# Patient Record
Sex: Male | Born: 2000 | Race: Black or African American | Hispanic: No | Marital: Single | Smoking: Never smoker
Health system: Southern US, Community
[De-identification: ages and names within clinical notes are randomized; demographics above are authoritative.]

---
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
## 2019-04-10 ENCOUNTER — Telehealth (HOSPITAL_COMMUNITY): Payer: Self-pay

## 2019-04-10 ENCOUNTER — Other Ambulatory Visit: Payer: Self-pay

## 2019-04-10 ENCOUNTER — Encounter (HOSPITAL_COMMUNITY): Payer: Self-pay | Admitting: Emergency Medicine

## 2019-04-10 ENCOUNTER — Emergency Department (HOSPITAL_COMMUNITY)
Admission: EM | Admit: 2019-04-10 | Discharge: 2019-04-10 | Disposition: A | Discharge status: Home / Self Care | Payer: 59 | Attending: Emergency Medicine | Admitting: Emergency Medicine

## 2019-04-10 DIAGNOSIS — R0981 Nasal congestion: Secondary | ICD-10-CM | POA: Insufficient documentation

## 2019-04-10 DIAGNOSIS — R05 Cough: Secondary | ICD-10-CM | POA: Insufficient documentation

## 2019-04-10 DIAGNOSIS — R0989 Other specified symptoms and signs involving the circulatory and respiratory systems: Secondary | ICD-10-CM

## 2019-04-10 DIAGNOSIS — R509 Fever, unspecified: Secondary | ICD-10-CM | POA: Diagnosis present

## 2019-04-10 DIAGNOSIS — Z20822 Contact with and (suspected) exposure to covid-19: Secondary | ICD-10-CM

## 2019-04-10 DIAGNOSIS — U071 COVID-19: Secondary | ICD-10-CM | POA: Insufficient documentation

## 2019-04-10 LAB — SARS CORONAVIRUS 2 (TAT 6-24 HRS): SARS Coronavirus 2: POSITIVE — AB

## 2019-04-10 MED ORDER — FLUTICASONE PROPIONATE 50 MCG/ACT NA SUSP: 1.0000 | Freq: Every day | NASAL | 0 refills | Status: AC

## 2019-04-10 MED ORDER — ACETAMINOPHEN 500 MG PO TABS
1000.0000 mg | ORAL_TABLET | Freq: Once | ORAL | Status: AC
  Administered 2019-04-10: 01:00:00 1000 mg via ORAL
  Filled 2019-04-10: qty 2

## 2019-04-10 MED ORDER — BENZONATATE 100 MG PO CAPS: 200.0000 mg | ORAL_CAPSULE | Freq: Two times a day (BID) | ORAL | 0 refills | Status: AC | PRN

## 2019-04-10 NOTE — ED Provider Notes (Signed)
[name] EMERGENCY DEPARTMENT Provider Note   CSN: [id number] Arrival date & time: 04/10/19  0018     History Chief Complaint  Patient presents with  . Nasal Congestion    Chad Bradley is a 19 y.o. male.  Patient presents to the emergency department with chief complaint of nasal congestion and chest congestion.  He denies any known fever, but is noted to be febrile to 100.8 in triage.  He denies any known sick contacts.  He states that he has not been around anyone with Covid-19.  He reports that he got the first round of his vaccine last week.  He denies any treatments prior to arrival.  Denies any other associated symptoms.  The history is provided by the patient. No language interpreter was used.       History reviewed. No pertinent past medical history.  There are no problems to display for this patient.   History reviewed. No pertinent surgical history.     No family history on file.  Social History   Tobacco Use  . Smoking status: Not on file  Substance Use Topics  . Alcohol use: Not on file  . Drug use: Not on file    Home Medications Prior to Admission medications   Medication Sig Start Date End Date Taking? Authorizing Provider  benzonatate (TESSALON) 100 MG capsule Take 2 capsules (200 mg total) by mouth 2 (two) times daily as needed for cough. 04/10/19   Roxy Horseman, PA-C  fluticasone (FLONASE) 50 MCG/ACT nasal spray Place 1 spray into both nostrils daily. 04/10/19   Roxy Horseman, PA-C    Allergies    Patient has no known allergies.  Review of Systems   Review of Systems  All other systems reviewed and are negative.   Physical Exam Updated Vital Signs BP (!) 141/81 (BP Location: Right Arm)   Pulse 93   Temp (!) 100.7 F (38.2 C)   Resp 16   SpO2 98%   Physical Exam Physical Exam  Constitutional: Pt  is oriented to person, place, and time. Appears well-developed and well-nourished. No distress.  HENT:  Head:  Normocephalic and atraumatic.  Right Ear: Tympanic membrane, external ear and ear canal normal.  Left Ear: Tympanic membrane, external ear and ear canal normal.  Nose: Mucosal edema and no rhinorrhea present. No epistaxis. Right sinus exhibits no maxillary sinus tenderness and no frontal sinus tenderness. Left sinus exhibits no maxillary sinus tenderness and no frontal sinus tenderness.  Mouth/Throat: Uvula is midline and mucous membranes are normal. Mucous membranes are not pale and not cyanotic. No oropharyngeal exudate, posterior oropharyngeal edema, posterior oropharyngeal erythema or tonsillar abscesses.  Eyes: Conjunctivae are normal. Pupils are equal, round, and reactive to light.  Neck: Normal range of motion and full passive range of motion without pain.  Cardiovascular: Normal rate and intact distal pulses.   Pulmonary/Chest: Effort normal and breath sounds normal. No stridor.  Clear and equal breath sounds without focal wheezes, rhonchi, rales  Abdominal: Soft. Bowel sounds are normal. There is no tenderness.  Musculoskeletal: Normal range of motion.  Lymphadenopathy:    Pthas no cervical adenopathy.  Neurological: Pt is alert and oriented to person, place, and time.  Skin: Skin is warm and dry. No rash noted. Pt is not diaphoretic.  Psychiatric: Normal mood and affect.  Nursing note and vitals reviewed.   ED Results / Procedures / Treatments   Labs (all labs ordered are listed, but only abnormal results are displayed) Labs  Reviewed  SARS CORONAVIRUS 2 (TAT 6-24 HRS)    EKG None  Radiology No results found.  Procedures Procedures (including critical care time)  Medications Ordered in ED Medications  acetaminophen (TYLENOL) tablet 1,000 mg (has no administration in time range)    ED Course  I have reviewed the triage vital signs and the nursing notes.  Pertinent labs & imaging results that were available during my care of the patient were reviewed by me and  considered in my medical decision making (see chart for details).    MDM Rules/Calculators/A&P                      Patient with low-grade fever of 100.8 degrees in triage.  He does have cough and sinus congestion.  Oropharynx is clear.  Lung sounds are clear.  Will prescribe Tessalon and Flonase.  Will check Covid.  Patient given instructions regarding return to work.  He is overall well-appearing, and in no acute distress.  I find him stable for discharged home with continued monitoring for symptoms and return for new or worsening symptoms.   Final Clinical Impression(s) / ED Diagnoses Final diagnoses:  Person under investigation for COVID-19  Sinus congestion  Chest congestion    Rx / DC Orders ED Discharge Orders         Ordered    fluticasone (FLONASE) 50 MCG/ACT nasal spray  Daily     04/10/19 0041    benzonatate (TESSALON) 100 MG capsule  2 times daily PRN     04/10/19 0041           Montine Circle, PA-C 04/10/19 0045    Mesner, Corene Cornea, MD 04/10/19 [phone]

## 2019-04-10 NOTE — ED Triage Notes (Signed)
Pt c/o nasal congestion and chest congestion that started last night.

## 2019-04-22 ENCOUNTER — Other Ambulatory Visit: Payer: Self-pay

## 2019-04-22 ENCOUNTER — Emergency Department (HOSPITAL_COMMUNITY)
Admission: EM | Admit: 2019-04-22 | Discharge: 2019-04-22 | Disposition: A | Discharge status: Home / Self Care | Payer: 59 | Attending: Emergency Medicine | Admitting: Emergency Medicine

## 2019-04-22 DIAGNOSIS — T162XXA Foreign body in left ear, initial encounter: Secondary | ICD-10-CM | POA: Diagnosis present

## 2019-04-22 DIAGNOSIS — Y929 Unspecified place or not applicable: Secondary | ICD-10-CM | POA: Diagnosis not present

## 2019-04-22 DIAGNOSIS — Y93E8 Activity, other personal hygiene: Secondary | ICD-10-CM | POA: Diagnosis not present

## 2019-04-22 DIAGNOSIS — X58XXXA Exposure to other specified factors, initial encounter: Secondary | ICD-10-CM | POA: Insufficient documentation

## 2019-04-22 DIAGNOSIS — Y999 Unspecified external cause status: Secondary | ICD-10-CM | POA: Insufficient documentation

## 2019-04-22 NOTE — Discharge Instructions (Addendum)
Return here as needed. Follow up with a primary doctor. °

## 2019-04-22 NOTE — ED Triage Notes (Signed)
Q-tip stuck in left ear since last night. Denies pain, dizziness or decrease in hearing. Also denies drainage.

## 2019-04-22 NOTE — ED Provider Notes (Signed)
[facility] Provider Note   CSN: [id number] Arrival date & time: 04/22/19  1131     History Chief Complaint  Patient presents with  . Foreign Body in Chad Bradley is a 19 y.o. male.  HPI Patient presents to the emergency department with the end of a Q-tip stuck in his left ear canal.  The patient states he was cleaning his ears last night when the and came off.  The patient states that he did not do anything to try to remove the Q-tip.  Patient states he is having no pain or other issues.  He states he is not having any hearing difficulty.    No past medical history on file.  There are no problems to display for this patient.   No past surgical history on file.     No family history on file.  Social History   Tobacco Use  . Smoking status: Not on file  Substance Use Topics  . Alcohol use: Not on file  . Drug use: Not on file    Home Medications Prior to Admission medications   Medication Sig Start Date End Date Taking? Authorizing Provider  benzonatate (TESSALON) 100 MG capsule Take 2 capsules (200 mg total) by mouth 2 (two) times daily as needed for cough. 04/10/19   Montine Circle, PA-C  fluticasone (FLONASE) 50 MCG/ACT nasal spray Place 1 spray into both nostrils daily. 04/10/19   Montine Circle, PA-C    Allergies    Patient has no known allergies.  Review of Systems   Review of Systems   All other systems negative except as documented in the HPI. All pertinent positives and negatives as reviewed in the HPI.  Marland Kitchen Physical Exam Updated Vital Signs BP 115/86   Pulse 65   Temp 99 F (37.2 C) (Oral)   Resp 15   SpO2 100%   Physical Exam HENT:     Head: Normocephalic and atraumatic.     Right Ear: Tympanic membrane normal.     Left Ear: Tympanic membrane normal. A foreign body is present.     Ears:     Comments: Cotton tip from a cotton tip swab.     Nose: Nose normal.     Mouth/Throat:     Mouth: Mucous  membranes are moist.     Pharynx: Oropharynx is clear.  Eyes:     Pupils: Pupils are equal, round, and reactive to light.  Neurological:     Mental Status: He is alert.     ED Results / Procedures / Treatments   Labs (all labs ordered are listed, but only abnormal results are displayed) Labs Reviewed - No data to display  EKG None  Radiology No results found.  Procedures .Foreign Body Removal  Date/Time: 04/22/2019 12:33 PM Performed by: Dalia Heading, PA-C Authorized by: Dalia Heading, PA-C  Consent: Verbal consent obtained. Written consent not obtained. Risks and benefits: risks, benefits and alternatives were discussed Consent given by: patient Patient identity confirmed: verbally with patient Time out: Immediately prior to procedure a "time out" was called to verify the correct patient, procedure, equipment, support staff and site/side marked as required. Body area: ear Location details: left ear  Sedation: Patient sedated: no  Patient restrained: no Localization method: magnification and visualized Removal mechanism: alligator forceps Complexity: simple 1 objects recovered. Objects recovered: end of cotton tip swab Post-procedure assessment: foreign body removed Patient tolerance: patient tolerated the procedure well with no immediate complications   (  including critical care time)  Medications Ordered in ED Medications - No data to display  ED Course  I have reviewed the triage vital signs and the nursing notes.  Pertinent labs & imaging results that were available during my care of the patient were reviewed by me and considered in my medical decision making (see chart for details).    MDM Rules/Calculators/A&P                     Patient has no TM issue.  The patient have a foreign body successfully removed with alligator forceps.  Final Clinical Impression(s) / ED Diagnoses Final diagnoses:  None    Rx / DC Orders ED Discharge  Orders    None       Charlestine Night, PA-C 04/22/19 1234    Derwood Kaplan, MD 04/24/19 [phone]

## 2022-10-10 ENCOUNTER — Other Ambulatory Visit: Payer: Self-pay

## 2022-10-10 ENCOUNTER — Ambulatory Visit
Admission: EM | Admit: 2022-10-10 | Discharge: 2022-10-10 | Disposition: A | Discharge status: Home / Self Care | Payer: PRIVATE HEALTH INSURANCE | Attending: Internal Medicine | Admitting: Internal Medicine

## 2022-10-10 DIAGNOSIS — Z113 Encounter for screening for infections with a predominantly sexual mode of transmission: Secondary | ICD-10-CM

## 2022-10-10 NOTE — ED Provider Notes (Signed)
[facility]    CSN: [id number] Arrival date & time: 10/10/22  1346      History   Chief Complaint Chief Complaint  Patient presents with   std testing    HPI Chad Bradley is a 22 y.o. male.   Patient presents today for routine STD testing.  He denies any associated symptoms or any exposure to STD.     History reviewed. No pertinent past medical history.  There are no problems to display for this patient.   History reviewed. No pertinent surgical history.     Home Medications    Prior to Admission medications   Medication Sig Start Date End Date Taking? Authorizing Provider  benzonatate (TESSALON) 100 MG capsule Take 2 capsules (200 mg total) by mouth 2 (two) times daily as needed for cough. 04/10/19   Roxy Horseman, PA-C  fluticasone (FLONASE) 50 MCG/ACT nasal spray Place 1 spray into both nostrils daily. 04/10/19   Roxy Horseman, PA-C    Family History History reviewed. No pertinent family history.  Social History Social History   Tobacco Use   Smoking status: Never   Smokeless tobacco: Never  Vaping Use   Vaping status: Some Days  Substance Use Topics   Alcohol use: Yes   Drug use: Yes    Types: Marijuana     Allergies   Patient has no known allergies.   Review of Systems Review of Systems Per HPI  Physical Exam Triage Vital Signs ED Triage Vitals  Encounter Vitals Group     BP 10/10/22 1409 135/65     Systolic BP Percentile --      Diastolic BP Percentile --      Pulse Rate 10/10/22 1409 62     Resp 10/10/22 1409 19     Temp 10/10/22 1409 98.5 F (36.9 C)     Temp Source 10/10/22 1409 Oral     SpO2 10/10/22 1409 98 %     Weight 10/10/22 1408 195 lb (88.5 kg)     Height 10/10/22 1408 6\' 3"  (1.905 m)     Head Circumference --      Peak Flow --      Pain Score 10/10/22 1408 0     Pain Loc --      Pain Education --      Exclude from Growth Chart --    No data found.  Updated Vital Signs BP 135/65   Pulse 62    Temp 98.5 F (36.9 C) (Oral)   Resp 19   Ht 6\' 3"  (1.905 m)   Wt 195 lb (88.5 kg)   SpO2 98%   BMI 24.37 kg/m   Visual Acuity Right Eye Distance:   Left Eye Distance:   Bilateral Distance:    Right Eye Near:   Left Eye Near:    Bilateral Near:     Physical Exam Constitutional:      General: He is not in acute distress.    Appearance: Normal appearance. He is not toxic-appearing or diaphoretic.  HENT:     Head: Normocephalic and atraumatic.  Eyes:     Extraocular Movements: Extraocular movements intact.     Conjunctiva/sclera: Conjunctivae normal.  Pulmonary:     Effort: Pulmonary effort is normal.  Genitourinary:    Comments: Deferred with shared decision making. Self swab performed.  Neurological:     General: No focal deficit present.     Mental Status: He is alert and oriented to person, place, and time. Mental  status is at baseline.  Psychiatric:        Mood and Affect: Mood normal.        Behavior: Behavior normal.        Thought Content: Thought content normal.        Judgment: Judgment normal.      UC Treatments / Results  Labs (all labs ordered are listed, but only abnormal results are displayed) Labs Reviewed  HIV ANTIBODY (ROUTINE TESTING W REFLEX)  RPR  CYTOLOGY, (ORAL, ANAL, URETHRAL) ANCILLARY ONLY    EKG   Radiology No results found.  Procedures Procedures (including critical care time)  Medications Ordered in UC Medications - No data to display  Initial Impression / Assessment and Plan / UC Course  I have reviewed the triage vital signs and the nursing notes.  Pertinent labs & imaging results that were available during my care of the patient were reviewed by me and considered in my medical decision making (see chart for details).     Patient presenting for routine STD testing without symptoms or exposure.  Cytology swab, HIV, RPR test pending.  Awaiting results.  Advised strict follow-up precautions.  Patient verbalized  understanding and was agreeable with plan. Final Clinical Impressions(s) / UC Diagnoses   Final diagnoses:  Screening examination for venereal disease     Discharge Instructions      STD testing is pending. Will call if anything is positive and send any treatment.     ED Prescriptions   None    PDMP not reviewed this encounter.   Gustavus Bryant, [place] 10/10/22 1451

## 2022-10-10 NOTE — ED Triage Notes (Signed)
Pt states that he is here for std testing. Pt denies having any symptoms.

## 2022-10-10 NOTE — Discharge Instructions (Signed)
STD testing is pending. Will call if anything is positive and send any treatment.

## 2022-10-11 LAB — CYTOLOGY, (ORAL, ANAL, URETHRAL) ANCILLARY ONLY
Chlamydia: POSITIVE — AB
Comment: NEGATIVE
Comment: NEGATIVE
Comment: NORMAL
Neisseria Gonorrhea: NEGATIVE
Trichomonas: NEGATIVE

## 2022-10-11 LAB — RPR: RPR Ser Ql: NONREACTIVE

## 2022-10-11 LAB — HIV ANTIBODY (ROUTINE TESTING W REFLEX): HIV Screen 4th Generation wRfx: NONREACTIVE

## 2022-10-12 ENCOUNTER — Telehealth: Payer: Self-pay

## 2022-10-12 MED ORDER — DOXYCYCLINE HYCLATE 100 MG PO CAPS: 100.0000 mg | ORAL_CAPSULE | Freq: Two times a day (BID) | ORAL | 0 refills | Status: AC

## 2022-10-12 NOTE — Telephone Encounter (Signed)
Contacted patient by phone.  Verified identity using two identifiers.  Provided positive result.  Reviewed safe sex practices, notifying partners, and refraining from sexual activities for 7 days from time of treatment.  Patient verified understanding, all questions answered.   Pt requires tx with Doxycycline. Reviewed with patient, verified pharmacy, prescription sent.

## 2023-03-07 ENCOUNTER — Other Ambulatory Visit: Payer: Self-pay

## 2023-03-07 ENCOUNTER — Ambulatory Visit
Admission: RE | Admit: 2023-03-07 | Discharge: 2023-03-07 | Disposition: A | Discharge status: Home / Self Care | Payer: Managed Care, Other (non HMO) | Source: Ambulatory Visit | Attending: Family Medicine | Admitting: Family Medicine

## 2023-03-07 VITALS — BP 121/76 | HR 62 | Temp 98.7°F | Resp 16

## 2023-03-07 DIAGNOSIS — Z711 Person with feared health complaint in whom no diagnosis is made: Secondary | ICD-10-CM | POA: Diagnosis present

## 2023-03-07 NOTE — Discharge Instructions (Addendum)
 Your lab results will be available within 24 to 48 hours.  If any treatment is warranted after review test results our staff will reach out to you via phone to advise of any necessary treatment.

## 2023-03-07 NOTE — ED Triage Notes (Signed)
 Requesting testing for STI- denies Sx, denies exposure

## 2023-03-07 NOTE — ED Provider Notes (Signed)
 [facility]    CSN: [id number] Arrival date & time: 03/07/23  1913      History   Chief Complaint Chief Complaint  Patient presents with   Labs Only    HPI Chad Bradley is a 23 y.o. male.   HPI Patient here today for routine STD screening.  Currently asymptomatic.  Would like HIV and RPR testing.  Denies any concerns of pregnancy. Patient is currently sexually active and does not routinely use barrier protection.   History reviewed. No pertinent past medical history.  There are no active problems to display for this patient.   History reviewed. No pertinent surgical history.     Home Medications    Prior to Admission medications   Medication Sig Start Date End Date Taking? Authorizing Provider  benzonatate (TESSALON) 100 MG capsule Take 2 capsules (200 mg total) by mouth 2 (two) times daily as needed for cough. Patient not taking: Reported on 03/07/2023 04/10/19   Roxy Horseman, PA-C  doxycycline (VIBRAMYCIN) 100 MG capsule Take 1 capsule (100 mg total) by mouth 2 (two) times daily. Patient not taking: Reported on 03/07/2023 10/12/22   Merrilee Jansky, MD  fluticasone [facility]) 50 MCG/ACT nasal spray Place 1 spray into both nostrils daily. Patient not taking: Reported on 03/07/2023 04/10/19   Roxy Horseman, PA-C    Family History History reviewed. No pertinent family history.  Social History Social History   Tobacco Use   Smoking status: Never   Smokeless tobacco: Never  Vaping Use   Vaping status: Some Days  Substance Use Topics   Alcohol use: Yes   Drug use: Yes    Types: Marijuana     Allergies   Patient has no known allergies.   Review of Systems Review of Systems Pertinent negatives listed in HPI   Physical Exam Triage Vital Signs ED Triage Vitals  Encounter Vitals Group     BP      Systolic BP Percentile      Diastolic BP Percentile      Pulse      Resp      Temp      Temp src      SpO2      Weight      Height       Head Circumference      Peak Flow      Pain Score      Pain Loc      Pain Education      Exclude from Growth Chart    No data found.  Updated Vital Signs BP 121/76 (BP Location: Left Arm)   Pulse 62   Temp 98.7 F (37.1 C) (Oral)   Resp 16   SpO2 98%   Visual Acuity Right Eye Distance:   Left Eye Distance:   Bilateral Distance:    Right Eye Near:   Left Eye Near:    Bilateral Near:     Physical Exam Vitals reviewed.  Constitutional:      Appearance: Normal appearance.  HENT:     Head: Normocephalic.  Cardiovascular:     Rate and Rhythm: Normal rate and regular rhythm.  Pulmonary:     Effort: Pulmonary effort is normal.     Breath sounds: Normal breath sounds.  Musculoskeletal:        General: Normal range of motion.  Neurological:     General: No focal deficit present.     Mental Status: He is alert.  UC Treatments / Results  Labs (all labs ordered are listed, but only abnormal results are displayed) Labs Reviewed  HIV ANTIBODY (ROUTINE TESTING W REFLEX)  RPR  CYTOLOGY, (ORAL, ANAL, URETHRAL) ANCILLARY ONLY    EKG   Radiology No results found.  Procedures Procedures (including critical care time)  Medications Ordered in UC Medications - No data to display  Initial Impression / Assessment and Plan / UC Course  I have reviewed the triage vital signs and the nursing notes.  Pertinent labs & imaging results that were available during my care of the patient were reviewed by me and considered in my medical decision making (see chart for details).    Routine STD screening, HIV/RPR, gonorrhea/chlamydia, trichomonas.  Patient had no known exposure.  No treatment provided given no exposure.  ACD prevention education provided. Final Clinical Impressions(s) / UC Diagnoses   Final diagnoses:  Concern about STD in male without diagnosis     Discharge Instructions      Your lab results will be available within 24 to 48 hours.  If any treatment  is warranted after review test results our staff will reach out to you via phone to advise of any necessary treatment.     ED Prescriptions   None    PDMP not reviewed this encounter.   Bing Neighbors, NP 03/07/23 2036

## 2023-03-08 LAB — CYTOLOGY, (ORAL, ANAL, URETHRAL) ANCILLARY ONLY
Chlamydia: NEGATIVE
Comment: NEGATIVE
Comment: NEGATIVE
Comment: NORMAL
Neisseria Gonorrhea: NEGATIVE
Trichomonas: NEGATIVE

## 2023-03-10 LAB — RPR: RPR Ser Ql: NONREACTIVE

## 2023-03-10 LAB — HIV ANTIBODY (ROUTINE TESTING W REFLEX): HIV Screen 4th Generation wRfx: NONREACTIVE

## 2023-03-12 ENCOUNTER — Telehealth: Payer: Self-pay | Admitting: *Deleted

## 2023-03-12 ENCOUNTER — Ambulatory Visit

## 2023-03-12 NOTE — Telephone Encounter (Signed)
 Pt scheduled appt for today at [facility] for TB skin test. Notified pt that those aren't done at this location. Gave contact info for [employer] [facility].

## 2023-03-16 ENCOUNTER — Ambulatory Visit (INDEPENDENT_AMBULATORY_CARE_PROVIDER_SITE_OTHER): Admitting: Family Medicine

## 2023-03-16 ENCOUNTER — Encounter: Payer: Self-pay | Admitting: Family Medicine

## 2023-03-16 VITALS — BP 123/77 | HR 69 | Temp 98.4°F | Resp 16 | Ht 75.0 in | Wt 200.4 lb

## 2023-03-16 DIAGNOSIS — Z0000 Encounter for general adult medical examination without abnormal findings: Secondary | ICD-10-CM | POA: Diagnosis not present

## 2023-03-16 DIAGNOSIS — Z111 Encounter for screening for respiratory tuberculosis: Secondary | ICD-10-CM

## 2023-03-16 DIAGNOSIS — Z130 Encounter for screening for diseases of the blood and blood-forming organs and certain disorders involving the immune mechanism: Secondary | ICD-10-CM

## 2023-03-16 DIAGNOSIS — Z7689 Persons encountering health services in other specified circumstances: Secondary | ICD-10-CM

## 2023-03-16 DIAGNOSIS — Z13220 Encounter for screening for lipoid disorders: Secondary | ICD-10-CM

## 2023-03-19 ENCOUNTER — Encounter: Payer: Self-pay | Admitting: Family Medicine

## 2023-03-19 NOTE — Progress Notes (Signed)
 New Patient Office Visit  Subjective    Patient ID: Chad Bradley, male    DOB: [birth year]  Age: 23 y.o. MRN: [id number]  CC:  Chief Complaint  Patient presents with   Establish Care   Annual Exam    Tb test    HPI Chad Bradley presents to establish care and for routine annual exam. Patient denies known chronic med issues or regular meds. Patient denies acute complaints.    Outpatient Encounter Medications as of 03/16/2023  Medication Sig   benzonatate (TESSALON) 100 MG capsule Take 2 capsules (200 mg total) by mouth 2 (two) times daily as needed for cough. (Patient not taking: Reported on 03/07/2023)   doxycycline (VIBRAMYCIN) 100 MG capsule Take 1 capsule (100 mg total) by mouth 2 (two) times daily. (Patient not taking: Reported on 03/07/2023)   fluticasone (FLONASE) 50 MCG/ACT nasal spray Place 1 spray into both nostrils daily. (Patient not taking: Reported on 03/07/2023)   No facility-administered encounter medications on file as of 03/16/2023.    History reviewed. No pertinent past medical history.  History reviewed. No pertinent surgical history.  History reviewed. No pertinent family history.  Social History   Socioeconomic History   Marital status: Single    Spouse name: Not on file   Number of children: Not on file   Years of education: Not on file   Highest education level: Bachelor's degree (e.g., BA, AB, BS)  Occupational History   Not on file  Tobacco Use   Smoking status: Never   Smokeless tobacco: Never  Vaping Use   Vaping status: Some Days  Substance and Sexual Activity   Alcohol use: Yes   Drug use: Yes    Types: Marijuana   Sexual activity: Yes    Birth control/protection: Condom  Other Topics Concern   Not on file  Social History Narrative   Not on file   Social Drivers of Health   Financial Resource Strain: Low Risk  (03/16/2023)   Overall Financial Resource Strain (CARDIA)    Difficulty of Paying Living Expenses: Not hard at all  Food  Insecurity: No Food Insecurity (03/16/2023)   Hunger Vital Sign    Worried About Running Out of Food in the Last Year: Never true    Ran Out of Food in the Last Year: Never true  Transportation Needs: No Transportation Needs (03/16/2023)   PRAPARE - Administrator, Civil Service (Medical): No    Lack of Transportation (Non-Medical): No  Physical Activity: Insufficiently Active (03/16/2023)   Exercise Vital Sign    Days of Exercise per Week: 3 days    Minutes of Exercise per Session: 30 min  Stress: No Stress Concern Present (03/16/2023)   [employer] of Occupational Health - Occupational Stress Questionnaire    Feeling of Stress : Not at all  Social Connections: Moderately Isolated (03/16/2023)   Social Connection and Isolation Panel [NHANES]    Frequency of Communication with Friends and Family: More than three times a week    Frequency of Social Gatherings with Friends and Family: More than three times a week    Attends Religious Services: More than 4 times per year    Active Member of [employer] or Organizations: No    Attends Banker Meetings: Never    Marital Status: Never married  Intimate Partner Violence: Not At Risk (03/16/2023)   Humiliation, Afraid, Rape, and Kick questionnaire    Fear of Current or Ex-Partner: No    Emotionally  Abused: No    Physically Abused: No    Sexually Abused: No    Review of Systems  All other systems reviewed and are negative.       Objective   BP 123/77   Pulse 69   Temp 98.4 F (36.9 C) (Oral)   Resp 16   Ht 6\' 3"  (1.905 m)   Wt 200 lb 6.4 oz (90.9 kg)   SpO2 97%   BMI 25.05 kg/m   Physical Exam Vitals and nursing note reviewed.  Constitutional:      General: He is not in acute distress. HENT:     Head: Normocephalic and atraumatic.     Right Ear: Tympanic membrane, ear canal and external ear normal.     Left Ear: Tympanic membrane, ear canal and external ear normal.     Nose: Nose normal.      Mouth/Throat:     Mouth: Mucous membranes are moist.     Pharynx: Oropharynx is clear.  Eyes:     Conjunctiva/sclera: Conjunctivae normal.     Pupils: Pupils are equal, round, and reactive to light.  Neck:     Thyroid: No thyromegaly.  Cardiovascular:     Rate and Rhythm: Normal rate and regular rhythm.     Heart sounds: Normal heart sounds. No murmur heard. Pulmonary:     Effort: Pulmonary effort is normal.     Breath sounds: Normal breath sounds.  Abdominal:     General: There is no distension.     Palpations: Abdomen is soft. There is no mass.     Tenderness: There is no abdominal tenderness.     Hernia: There is no hernia in the left inguinal area or right inguinal area.  Genitourinary:    Penis: Normal and circumcised.      Testes: Normal.  Musculoskeletal:        General: Normal range of motion.     Cervical back: Normal range of motion and neck supple.     Right lower leg: No edema.     Left lower leg: No edema.  Skin:    General: Skin is warm and dry.  Neurological:     General: No focal deficit present.     Mental Status: He is alert and oriented to person, place, and time. Mental status is at baseline.  Psychiatric:        Mood and Affect: Mood normal.        Behavior: Behavior normal.         Assessment & Plan:   Annual physical exam -     CMP14+EGFR  Screening for deficiency anemia -     CBC with Differential/Platelet  Screening for lipid disorders -     Lipid panel  Screening-pulmonary TB -     QuantiFERON-TB Gold Plus  Encounter to establish care     Return in about 1 year (around 03/15/2024) for physical.   Tommie Raymond, MD

## 2023-03-20 LAB — CMP14+EGFR
ALT: 14 IU/L (ref 0–44)
AST: 19 IU/L (ref 0–40)
Albumin: 4.6 g/dL (ref 4.3–5.2)
Alkaline Phosphatase: 59 IU/L (ref 44–121)
BUN/Creatinine Ratio: 10 (ref 9–20)
BUN: 12 mg/dL (ref 6–20)
Bilirubin Total: 0.6 mg/dL (ref 0.0–1.2)
CO2: 24 mmol/L (ref 20–29)
Calcium: 9.7 mg/dL (ref 8.7–10.2)
Chloride: 101 mmol/L (ref 96–106)
Creatinine, Ser: 1.16 mg/dL (ref 0.76–1.27)
Globulin, Total: 2.8 g/dL (ref 1.5–4.5)
Glucose: 85 mg/dL (ref 70–99)
Potassium: 4.3 mmol/L (ref 3.5–5.2)
Sodium: 140 mmol/L (ref 134–144)
Total Protein: 7.4 g/dL (ref 6.0–8.5)
eGFR: 91 mL/min/{1.73_m2} (ref >59)

## 2023-03-20 LAB — CBC WITH DIFFERENTIAL/PLATELET
Basophils Absolute: 0 10*3/uL (ref 0.0–0.2)
Basos: 1 %
EOS (ABSOLUTE): 0.3 10*3/uL (ref 0.0–0.4)
Eos: 6 %
Hematocrit: 46.2 % (ref 37.5–51.0)
Hemoglobin: 15.6 g/dL (ref 13.0–17.7)
Immature Grans (Abs): 0 10*3/uL (ref 0.0–0.1)
Immature Granulocytes: 0 %
Lymphocytes Absolute: 1.5 10*3/uL (ref 0.7–3.1)
Lymphs: 34 %
MCH: 30.3 pg (ref 26.6–33.0)
MCHC: 33.8 g/dL (ref 31.5–35.7)
MCV: 90 fL (ref 79–97)
Monocytes Absolute: 0.4 10*3/uL (ref 0.1–0.9)
Monocytes: 10 %
Neutrophils Absolute: 2.1 10*3/uL (ref 1.4–7.0)
Neutrophils: 49 %
Platelets: 212 10*3/uL (ref 150–450)
RBC: 5.15 x10E6/uL (ref 4.14–5.80)
RDW: 12.9 % (ref 11.6–15.4)
WBC: 4.3 10*3/uL (ref 3.4–10.8)

## 2023-03-20 LAB — QUANTIFERON-TB GOLD PLUS
QuantiFERON Mitogen Value: >10 [IU]/mL
QuantiFERON Nil Value: 0.02 [IU]/mL
QuantiFERON TB1 Ag Value: 0.01 [IU]/mL
QuantiFERON TB2 Ag Value: 0.01 [IU]/mL
QuantiFERON-TB Gold Plus: NEGATIVE

## 2023-03-20 LAB — LIPID PANEL
Chol/HDL Ratio: 2 ratio (ref 0.0–5.0)
Cholesterol, Total: 134 mg/dL (ref 100–199)
HDL: 67 mg/dL (ref >39)
LDL Chol Calc (NIH): 58 mg/dL (ref 0–99)
Triglycerides: 37 mg/dL (ref 0–149)
VLDL Cholesterol Cal: 9 mg/dL (ref 5–40)

## 2023-03-22 ENCOUNTER — Encounter: Payer: Self-pay | Admitting: Family Medicine

## 2024-03-17 ENCOUNTER — Encounter: Admitting: Family Medicine
